# Patient Record
Sex: Female | Born: 1980 | Race: White | Hispanic: No | Marital: Single
Health system: Southern US, Community
[De-identification: ages and names within clinical notes are randomized; demographics above are authoritative.]

## PROBLEM LIST (undated history)

## (undated) DIAGNOSIS — J45909 Unspecified asthma, uncomplicated: Secondary | ICD-10-CM

## (undated) DIAGNOSIS — Z5189 Encounter for other specified aftercare: Secondary | ICD-10-CM

## (undated) DIAGNOSIS — D689 Coagulation defect, unspecified: Secondary | ICD-10-CM

## (undated) HISTORY — DX: Coagulation defect, unspecified: D68.9

## (undated) HISTORY — DX: Encounter for other specified aftercare: Z51.89

## (undated) HISTORY — PX: BRAIN SURGERY: SHX531

## (undated) HISTORY — PX: CHOLECYSTECTOMY: SHX55

## (undated) HISTORY — DX: Unspecified asthma, uncomplicated: J45.909

---

## 2003-08-31 ENCOUNTER — Other Ambulatory Visit: Payer: Self-pay

## 2004-06-30 ENCOUNTER — Ambulatory Visit: Payer: Self-pay | Admitting: Specialist

## 2004-09-27 ENCOUNTER — Emergency Department: Payer: Self-pay | Admitting: Emergency Medicine
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
## 2005-06-11 ENCOUNTER — Emergency Department: Payer: Self-pay | Admitting: Emergency Medicine

## 2005-07-02 ENCOUNTER — Emergency Department: Payer: Self-pay | Admitting: Emergency Medicine

## 2005-07-23 ENCOUNTER — Emergency Department: Payer: Self-pay | Admitting: Unknown Physician Specialty

## 2006-11-01 ENCOUNTER — Ambulatory Visit: Payer: Self-pay | Admitting: Specialist

## 2006-11-05 ENCOUNTER — Ambulatory Visit: Payer: Self-pay | Admitting: Specialist

## 2006-11-07 ENCOUNTER — Emergency Department: Payer: Self-pay | Admitting: Emergency Medicine

## 2007-07-24 ENCOUNTER — Emergency Department: Payer: Self-pay | Admitting: Emergency Medicine

## 2007-12-09 ENCOUNTER — Emergency Department: Payer: Self-pay | Admitting: Emergency Medicine

## 2007-12-12 ENCOUNTER — Emergency Department: Payer: Self-pay | Admitting: Emergency Medicine

## 2007-12-19 ENCOUNTER — Emergency Department: Payer: Self-pay | Admitting: Emergency Medicine

## 2008-02-14 ENCOUNTER — Emergency Department: Payer: Self-pay | Admitting: Emergency Medicine

## 2008-05-03 ENCOUNTER — Ambulatory Visit: Payer: Self-pay

## 2008-08-11 ENCOUNTER — Emergency Department: Payer: Self-pay | Admitting: Emergency Medicine

## 2009-04-12 ENCOUNTER — Emergency Department: Payer: Self-pay | Admitting: Emergency Medicine

## 2009-06-09 ENCOUNTER — Emergency Department: Payer: Self-pay | Admitting: Emergency Medicine

## 2009-09-28 ENCOUNTER — Emergency Department: Payer: Self-pay | Admitting: Emergency Medicine

## 2009-12-10 ENCOUNTER — Emergency Department: Payer: Self-pay | Admitting: Emergency Medicine

## 2010-05-16 ENCOUNTER — Emergency Department: Payer: Self-pay | Admitting: Internal Medicine

## 2010-07-31 ENCOUNTER — Emergency Department: Payer: Self-pay | Admitting: Emergency Medicine

## 2011-02-24 ENCOUNTER — Emergency Department: Payer: Self-pay | Admitting: Emergency Medicine

## 2011-07-28 ENCOUNTER — Emergency Department: Payer: Self-pay | Admitting: Internal Medicine

## 2011-07-28 LAB — CBC WITH DIFFERENTIAL/PLATELET
Basophil #: 0 10*3/uL (ref 0.0–0.1)
Lymphocyte #: 1.6 10*3/uL (ref 1.0–3.6)
MCH: 31.7 pg (ref 26.0–34.0)
MCHC: 33.8 g/dL (ref 32.0–36.0)
MCV: 94 fL (ref 80–100)
Monocyte #: 0.4 10*3/uL (ref 0.0–0.7)
Platelet: 231 10*3/uL (ref 150–440)
RDW: 12.7 % (ref 11.5–14.5)

## 2011-07-28 LAB — BASIC METABOLIC PANEL
Anion Gap: 9 (ref 7–16)
Calcium, Total: 9.1 mg/dL (ref 8.5–10.1)
Chloride: 105 mmol/L (ref 98–107)
EGFR (African American): >60
EGFR (Non-African Amer.): >60
Glucose: 88 mg/dL (ref 65–99)
Potassium: 4.1 mmol/L (ref 3.5–5.1)
Sodium: 140 mmol/L (ref 136–145)

## 2011-11-26 ENCOUNTER — Emergency Department: Payer: Self-pay | Admitting: Emergency Medicine

## 2011-11-26 LAB — BASIC METABOLIC PANEL
Anion Gap: 8 (ref 7–16)
Creatinine: 0.92 mg/dL (ref 0.60–1.30)
Osmolality: 279 (ref 275–301)
Potassium: 3.8 mmol/L (ref 3.5–5.1)

## 2011-11-26 LAB — CBC
MCH: 31.1 pg (ref 26.0–34.0)
MCHC: 33.2 g/dL (ref 32.0–36.0)
MCV: 94 fL (ref 80–100)
RBC: 4.28 10*6/uL (ref 3.80–5.20)
RDW: 12.7 % (ref 11.5–14.5)

## 2012-02-13 ENCOUNTER — Ambulatory Visit: Payer: Self-pay

## 2012-02-13 ENCOUNTER — Other Ambulatory Visit: Payer: Self-pay | Admitting: Occupational Medicine

## 2012-02-13 DIAGNOSIS — R52 Pain, unspecified: Secondary | ICD-10-CM

## 2012-03-24 ENCOUNTER — Emergency Department: Payer: Self-pay | Admitting: Emergency Medicine

## 2012-03-24 LAB — COMPREHENSIVE METABOLIC PANEL
Albumin: 4.1 g/dL (ref 3.4–5.0)
Anion Gap: 7 (ref 7–16)
Calcium, Total: 8.7 mg/dL (ref 8.5–10.1)
Chloride: 104 mmol/L (ref 98–107)
Co2: 29 mmol/L (ref 21–32)
EGFR (African American): >60
EGFR (Non-African Amer.): >60
Osmolality: 278 (ref 275–301)
Potassium: 3.4 mmol/L — ABNORMAL LOW (ref 3.5–5.1)
SGOT(AST): 30 U/L (ref 15–37)
Sodium: 140 mmol/L (ref 136–145)

## 2012-03-24 LAB — CBC
HCT: 37.2 % (ref 35.0–47.0)
Platelet: 220 10*3/uL (ref 150–440)
RBC: 4.05 10*6/uL (ref 3.80–5.20)
RDW: 13.1 % (ref 11.5–14.5)
WBC: 7.5 10*3/uL (ref 3.6–11.0)

## 2012-03-24 LAB — TROPONIN I: Troponin-I: <0.02 ng/mL

## 2012-09-11 ENCOUNTER — Emergency Department: Payer: Self-pay | Admitting: Emergency Medicine

## 2013-09-02 ENCOUNTER — Emergency Department: Payer: Self-pay | Admitting: Emergency Medicine

## 2013-09-02 ENCOUNTER — Ambulatory Visit: Payer: Self-pay | Admitting: Podiatry

## 2013-09-02 LAB — CBC
HCT: 37.9 % (ref 35.0–47.0)
HGB: 12.6 g/dL (ref 12.0–16.0)
MCH: 31.3 pg (ref 26.0–34.0)
MCHC: 33.2 g/dL (ref 32.0–36.0)
MCV: 94 fL (ref 80–100)
Platelet: 206 10*3/uL (ref 150–440)
RBC: 4.02 10*6/uL (ref 3.80–5.20)
RDW: 13.1 % (ref 11.5–14.5)
WBC: 10.6 10*3/uL (ref 3.6–11.0)

## 2013-09-02 LAB — COMPREHENSIVE METABOLIC PANEL
ALK PHOS: 63 U/L
Albumin: 4 g/dL (ref 3.4–5.0)
Anion Gap: 6 — ABNORMAL LOW (ref 7–16)
BUN: 14 mg/dL (ref 7–18)
Bilirubin,Total: 0.4 mg/dL (ref 0.2–1.0)
CREATININE: 0.77 mg/dL (ref 0.60–1.30)
Calcium, Total: 8.7 mg/dL (ref 8.5–10.1)
Chloride: 105 mmol/L (ref 98–107)
Co2: 26 mmol/L (ref 21–32)
EGFR (Non-African Amer.): >60
Glucose: 115 mg/dL — ABNORMAL HIGH (ref 65–99)
Osmolality: 275 (ref 275–301)
Potassium: 3.4 mmol/L — ABNORMAL LOW (ref 3.5–5.1)
SGOT(AST): 28 U/L (ref 15–37)
SGPT (ALT): 30 U/L (ref 12–78)
Sodium: 137 mmol/L (ref 136–145)
TOTAL PROTEIN: 7.7 g/dL (ref 6.4–8.2)

## 2013-09-02 LAB — CK TOTAL AND CKMB (NOT AT ARMC)
CK, TOTAL: 258 U/L — AB
CK-MB: 5 ng/mL — ABNORMAL HIGH (ref 0.5–3.6)

## 2013-09-02 LAB — TROPONIN I: Troponin-I: <0.02 ng/mL

## 2013-09-16 ENCOUNTER — Ambulatory Visit: Payer: Self-pay | Admitting: Podiatry

## 2014-03-26 ENCOUNTER — Emergency Department: Payer: Self-pay | Admitting: Emergency Medicine

## 2014-10-06 ENCOUNTER — Ambulatory Visit: Payer: Self-pay | Admitting: General Surgery

## 2014-11-03 ENCOUNTER — Inpatient Hospital Stay: Admission: RE | Admit: 2014-11-03 | Payer: Self-pay | Source: Ambulatory Visit

## 2014-11-05 ENCOUNTER — Ambulatory Visit: Admission: RE | Admit: 2014-11-05 | Payer: BLUE CROSS/BLUE SHIELD | Source: Ambulatory Visit

## 2014-11-08 ENCOUNTER — Encounter: Admission: RE | Payer: Self-pay | Source: Ambulatory Visit

## 2014-11-08 SURGERY — CORRECTION, HALLUX VALGUS
Anesthesia: Monitor Anesthesia Care | Laterality: Right

## 2014-12-16 ENCOUNTER — Encounter (HOSPITAL_COMMUNITY): Payer: Self-pay | Admitting: Emergency Medicine

## 2014-12-16 ENCOUNTER — Emergency Department (HOSPITAL_COMMUNITY): Payer: Worker's Compensation

## 2014-12-16 ENCOUNTER — Emergency Department (HOSPITAL_COMMUNITY)
Admission: EM | Admit: 2014-12-16 | Discharge: 2014-12-16 | Disposition: A | Discharge status: Home / Self Care | Payer: Worker's Compensation | Attending: Emergency Medicine | Admitting: Emergency Medicine

## 2014-12-16 DIAGNOSIS — Z87828 Personal history of other (healed) physical injury and trauma: Secondary | ICD-10-CM | POA: Diagnosis not present

## 2014-12-16 DIAGNOSIS — K029 Dental caries, unspecified: Secondary | ICD-10-CM | POA: Diagnosis not present

## 2014-12-16 DIAGNOSIS — Z720 Tobacco use: Secondary | ICD-10-CM | POA: Diagnosis not present

## 2014-12-16 DIAGNOSIS — S199XXA Unspecified injury of neck, initial encounter: Secondary | ICD-10-CM | POA: Diagnosis present

## 2014-12-16 DIAGNOSIS — Y929 Unspecified place or not applicable: Secondary | ICD-10-CM | POA: Diagnosis not present

## 2014-12-16 DIAGNOSIS — Z3202 Encounter for pregnancy test, result negative: Secondary | ICD-10-CM | POA: Insufficient documentation

## 2014-12-16 DIAGNOSIS — M62838 Other muscle spasm: Secondary | ICD-10-CM

## 2014-12-16 DIAGNOSIS — Z23 Encounter for immunization: Secondary | ICD-10-CM | POA: Diagnosis not present

## 2014-12-16 DIAGNOSIS — S0031XA Abrasion of nose, initial encounter: Secondary | ICD-10-CM | POA: Diagnosis not present

## 2014-12-16 DIAGNOSIS — W2209XA Striking against other stationary object, initial encounter: Secondary | ICD-10-CM | POA: Insufficient documentation

## 2014-12-16 DIAGNOSIS — S0033XA Contusion of nose, initial encounter: Secondary | ICD-10-CM | POA: Diagnosis not present

## 2014-12-16 DIAGNOSIS — R52 Pain, unspecified: Secondary | ICD-10-CM

## 2014-12-16 DIAGNOSIS — Y939 Activity, unspecified: Secondary | ICD-10-CM | POA: Diagnosis not present

## 2014-12-16 DIAGNOSIS — Y990 Civilian activity done for income or pay: Secondary | ICD-10-CM | POA: Insufficient documentation

## 2014-12-16 LAB — POC URINE PREG, ED: PREG TEST UR: NEGATIVE

## 2014-12-16 MED ORDER — NAPROXEN 250 MG PO TABS
500.0000 mg | ORAL_TABLET | Freq: Once | ORAL | Status: AC
  Administered 2014-12-16: 500 mg via ORAL
  Filled 2014-12-16: qty 2

## 2014-12-16 MED ORDER — TETANUS-DIPHTH-ACELL PERTUSSIS 5-2.5-18.5 LF-MCG/0.5 IM SUSP
0.5000 mL | Freq: Once | INTRAMUSCULAR | Status: AC
  Administered 2014-12-16: 0.5 mL via INTRAMUSCULAR
  Filled 2014-12-16: qty 0.5

## 2014-12-16 MED ORDER — MELOXICAM 7.5 MG PO TABS: 7.5000 mg | ORAL_TABLET | Freq: Every day | ORAL | Status: DC

## 2014-12-16 MED ORDER — METHOCARBAMOL 500 MG PO TABS: 500.0000 mg | ORAL_TABLET | Freq: Two times a day (BID) | ORAL | Status: DC

## 2014-12-16 MED ORDER — METHOCARBAMOL 500 MG PO TABS
1000.0000 mg | ORAL_TABLET | Freq: Once | ORAL | Status: AC
  Administered 2014-12-16: 1000 mg via ORAL
  Filled 2014-12-16: qty 2
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
## 2014-12-16 NOTE — ED Notes (Signed)
Pt stable, ambulatory, states understanding of discharge instructions 
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
## 2015-03-14 ENCOUNTER — Emergency Department
Admission: EM | Admit: 2015-03-14 | Discharge: 2015-03-14 | Disposition: A | Discharge status: Home / Self Care | Payer: Self-pay | Attending: Emergency Medicine | Admitting: Emergency Medicine

## 2015-03-14 ENCOUNTER — Emergency Department: Payer: Self-pay

## 2015-03-14 ENCOUNTER — Emergency Department: Payer: BLUE CROSS/BLUE SHIELD

## 2015-03-14 ENCOUNTER — Encounter: Payer: Self-pay | Admitting: Emergency Medicine

## 2015-03-14 DIAGNOSIS — Z720 Tobacco use: Secondary | ICD-10-CM | POA: Insufficient documentation

## 2015-03-14 DIAGNOSIS — R42 Dizziness and giddiness: Secondary | ICD-10-CM | POA: Insufficient documentation

## 2015-03-14 LAB — CBC WITH DIFFERENTIAL/PLATELET
Basophils Absolute: 0.1 10*3/uL (ref 0–0.1)
Basophils Relative: 1 %
Eosinophils Absolute: 0.1 10*3/uL (ref 0–0.7)
Eosinophils Relative: 1 %
HEMATOCRIT: 34.4 % — AB (ref 35.0–47.0)
Hemoglobin: 11.9 g/dL — ABNORMAL LOW (ref 12.0–16.0)
LYMPHS PCT: 26 %
Lymphs Abs: 2.4 10*3/uL (ref 1.0–3.6)
MCH: 31.7 pg (ref 26.0–34.0)
MCHC: 34.5 g/dL (ref 32.0–36.0)
MCV: 91.9 fL (ref 80.0–100.0)
MONO ABS: 0.5 10*3/uL (ref 0.2–0.9)
Monocytes Relative: 6 %
NEUTROS ABS: 6 10*3/uL (ref 1.4–6.5)
Neutrophils Relative %: 66 %
Platelets: 199 10*3/uL (ref 150–440)
RBC: 3.75 MIL/uL — ABNORMAL LOW (ref 3.80–5.20)
RDW: 12.5 % (ref 11.5–14.5)
WBC: 9 10*3/uL (ref 3.6–11.0)

## 2015-03-14 LAB — BASIC METABOLIC PANEL
Anion gap: 3 — ABNORMAL LOW (ref 5–15)
BUN: 16 mg/dL (ref 6–20)
CO2: 27 mmol/L (ref 22–32)
CREATININE: 0.65 mg/dL (ref 0.44–1.00)
Calcium: 8.7 mg/dL — ABNORMAL LOW (ref 8.9–10.3)
Chloride: 108 mmol/L (ref 101–111)
GFR calc Af Amer: >60 mL/min (ref >60)
GLUCOSE: 103 mg/dL — AB (ref 65–99)
Potassium: 3.5 mmol/L (ref 3.5–5.1)
Sodium: 138 mmol/L (ref 135–145)

## 2015-03-14 MED ORDER — MECLIZINE HCL 25 MG PO TABS
ORAL_TABLET | ORAL | Status: AC
  Filled 2015-03-14: qty 1

## 2015-03-14 MED ORDER — MECLIZINE HCL 25 MG PO TABS: 25.0000 mg | ORAL_TABLET | Freq: Three times a day (TID) | ORAL | Status: DC | PRN

## 2015-03-14 MED ORDER — MECLIZINE HCL 25 MG PO TABS
25.0000 mg | ORAL_TABLET | Freq: Once | ORAL | Status: AC
  Administered 2015-03-14: 25 mg via ORAL

## 2015-03-14 NOTE — ED Notes (Signed)
Pt up to br and still reports dizziness is the same.

## 2015-03-14 NOTE — Discharge Instructions (Signed)
Dizziness °Dizziness is a common problem. It makes you feel unsteady or lightheaded. You may feel like you are about to pass out (faint). Dizziness can lead to injury if you stumble or fall. Anyone can get dizzy, but dizziness is more common in older adults. This condition can be caused by a number of things, including: °· Medicines. °· Dehydration. °· Illness. °HOME CARE °Following these instructions may help with your condition: °Eating and Drinking °· Drink enough fluid to keep your pee (urine) clear or pale yellow. This helps to keep you from getting dehydrated. Try to drink more clear fluids, such as water. °· Do not drink alcohol. °· Limit how much caffeine you drink or eat if told by your doctor. °· Limit how much salt you drink or eat if told by your doctor. °Activity °· Avoid making quick movements. °¨ When you stand up from sitting in a chair, steady yourself until you feel okay. °¨ In the morning, first sit up on the side of the bed. When you feel okay, stand slowly while you hold onto something. Do this until you know that your balance is fine. °· Move your legs often if you need to stand in one place for a long time. Tighten and relax your muscles in your legs while you are standing. °· Do not drive or use heavy machinery if you feel dizzy. °· Avoid bending down if you feel dizzy. Place items in your home so that they are easy for you to reach without leaning over. °Lifestyle °· Do not use any tobacco products, including cigarettes, chewing tobacco, or electronic cigarettes. If you need help quitting, ask your doctor. °· Try to lower your stress level, such as with yoga or meditation. Talk with your doctor if you need help. °General Instructions °· Watch your dizziness for any changes. °· Take medicines only as told by your doctor. Talk with your doctor if you think that your dizziness is caused by a medicine that you are taking. °· Tell a friend or a family member that you are feeling dizzy. If he or  she notices any changes in your behavior, have this person call your doctor. °· Keep all follow-up visits as told by your doctor. This is important. °GET HELP IF: °· Your dizziness does not go away. °· Your dizziness or light-headedness gets worse. °· You feel sick to your stomach (nauseous). °· You have trouble hearing. °· You have new symptoms. °· You are unsteady on your feet or you feel like the room is spinning. °GET HELP RIGHT AWAY IF: °· You throw up (vomit) or have diarrhea and are unable to eat or drink anything. °· You have trouble: °¨ Talking. °¨ Walking. °¨ Swallowing. °¨ Using your arms, hands, or legs. °· You feel generally weak. °· You are not thinking clearly or you have trouble forming sentences. It may take a friend or family member to notice this. °· You have: °¨ Chest pain. °¨ Pain in your belly (abdomen). °¨ Shortness of breath. °¨ Sweating. °· Your vision changes. °· You are bleeding. °· You have a headache. °· You have neck pain or a stiff neck. °· You have a fever. °  °This information is not intended to replace advice given to you by your health care provider. Make sure you discuss any questions you have with your health care provider. °  °Document Released: 05/10/2011 Document Revised: 10/05/2014 Document Reviewed: 05/17/2014 °Elsevier Interactive Patient Education ©2016 [employer] ° °
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
## 2015-03-14 NOTE — ED Notes (Signed)
Report given to Nellie, RN.  
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
## 2015-05-05 ENCOUNTER — Encounter: Payer: Self-pay | Admitting: Emergency Medicine

## 2015-05-05 ENCOUNTER — Emergency Department
Admission: EM | Admit: 2015-05-05 | Discharge: 2015-05-05 | Disposition: A | Discharge status: Home / Self Care | Payer: Self-pay | Attending: Emergency Medicine | Admitting: Emergency Medicine

## 2015-05-05 DIAGNOSIS — Y990 Civilian activity done for income or pay: Secondary | ICD-10-CM | POA: Insufficient documentation

## 2015-05-05 DIAGNOSIS — S0182XA Laceration with foreign body of other part of head, initial encounter: Secondary | ICD-10-CM | POA: Insufficient documentation

## 2015-05-05 DIAGNOSIS — Y9389 Activity, other specified: Secondary | ICD-10-CM | POA: Insufficient documentation

## 2015-05-05 DIAGNOSIS — S0085XA Superficial foreign body of other part of head, initial encounter: Secondary | ICD-10-CM

## 2015-05-05 DIAGNOSIS — Y9289 Other specified places as the place of occurrence of the external cause: Secondary | ICD-10-CM | POA: Insufficient documentation

## 2015-05-05 DIAGNOSIS — Y288XXA Contact with other sharp object, undetermined intent, initial encounter: Secondary | ICD-10-CM | POA: Insufficient documentation

## 2015-05-05 DIAGNOSIS — F17200 Nicotine dependence, unspecified, uncomplicated: Secondary | ICD-10-CM | POA: Insufficient documentation

## 2015-05-05 MED ORDER — LIDOCAINE-EPINEPHRINE (PF) 1 %-1:200000 IJ SOLN
INTRAMUSCULAR | Status: AC
  Filled 2015-05-05: qty 30

## 2015-05-05 NOTE — ED Notes (Signed)
States she thinks she may have some shaving from a machine in face.   This happened about 3-4 weeks ago   And tried to get it out herself  Now having increased pain
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
## 2015-05-05 NOTE — Discharge Instructions (Signed)
Sliver Removal, Care After A sliver--also called a splinter--is a small and thin broken piece of an object that gets stuck (embedded) under the skin. A sliver can create a deep wound that can easily become infected. It is important to care for the wound after a sliver is removed to help prevent infection and other problems from developing. WHAT TO EXPECT AFTER THE PROCEDURE Slivers often break into smaller pieces when they are removed. If pieces of your sliver broke off and stayed in your skin, you will eventually see them working themselves out and you may feel some pain at the wound site. This is normal. HOME CARE INSTRUCTIONS  Keep all follow-up visits as directed by your health care provider. This is important.  There are many different ways to close and cover a wound, including stitches (sutures) and adhesive strips. Follow your health care provider's instructions about:  Wound care.  Bandage (dressing) changes and removal.  Wound closure removal.  Check the wound site every day for signs of infection. Watch for:  Red streaks coming from the wound.  Fever.  Redness or tenderness around the wound.  Fluid, blood, or pus coming from the wound.  A bad smell coming from the wound. SEEK MEDICAL CARE IF:  You think that a piece of the sliver is still in your skin.  Your wound was closed, as with sutures, and the edges of the wound break open.  You have signs of infection, including:  New or worsening redness around the wound.  New or worsening tenderness around the wound.  Fluid, blood, or pus coming from the wound.  A bad smell coming from the wound or dressing. SEEK IMMEDIATE MEDICAL CARE IF: You have any of the following signs of infection:  Red streaks coming from the wound.  An unexplained fever.   This information is not intended to replace advice given to you by your health care provider. Make sure you discuss any questions you have with your health care  provider.   Document Released: 05/18/2000 Document Revised: 06/11/2014 Document Reviewed: 01/21/2014 Elsevier Interactive Patient Education 2016 [employer]  

## 2016-05-23 ENCOUNTER — Emergency Department: Payer: Self-pay

## 2016-05-23 ENCOUNTER — Encounter: Payer: Self-pay | Admitting: Emergency Medicine

## 2016-05-23 ENCOUNTER — Emergency Department
Admission: EM | Admit: 2016-05-23 | Discharge: 2016-05-23 | Disposition: A | Discharge status: Home / Self Care | Payer: Self-pay | Attending: Emergency Medicine | Admitting: Emergency Medicine

## 2016-05-23 DIAGNOSIS — Z791 Long term (current) use of non-steroidal anti-inflammatories (NSAID): Secondary | ICD-10-CM | POA: Insufficient documentation

## 2016-05-23 DIAGNOSIS — F17200 Nicotine dependence, unspecified, uncomplicated: Secondary | ICD-10-CM | POA: Insufficient documentation

## 2016-05-23 DIAGNOSIS — N939 Abnormal uterine and vaginal bleeding, unspecified: Secondary | ICD-10-CM | POA: Insufficient documentation

## 2016-05-23 DIAGNOSIS — R1031 Right lower quadrant pain: Secondary | ICD-10-CM

## 2016-05-23 LAB — URINALYSIS, COMPLETE (UACMP) WITH MICROSCOPIC
Bilirubin Urine: NEGATIVE
Glucose, UA: NEGATIVE mg/dL
Hgb urine dipstick: NEGATIVE
Ketones, ur: NEGATIVE mg/dL
LEUKOCYTES UA: NEGATIVE
Nitrite: NEGATIVE
PH: 5 (ref 5.0–8.0)
Protein, ur: 30 mg/dL — AB
Specific Gravity, Urine: 1.025 (ref 1.005–1.030)

## 2016-05-23 LAB — CHLAMYDIA/NGC RT PCR (ARMC ONLY)
CHLAMYDIA TR: NOT DETECTED
N GONORRHOEAE: NOT DETECTED

## 2016-05-23 LAB — CBC WITH DIFFERENTIAL/PLATELET
BASOS ABS: 0.1 10*3/uL (ref 0–0.1)
Basophils Relative: 1 %
Eosinophils Absolute: 0.1 10*3/uL (ref 0–0.7)
Eosinophils Relative: 1 %
HEMATOCRIT: 38.1 % (ref 35.0–47.0)
Hemoglobin: 13.2 g/dL (ref 12.0–16.0)
LYMPHS ABS: 2 10*3/uL (ref 1.0–3.6)
Lymphocytes Relative: 20 %
MCH: 31.7 pg (ref 26.0–34.0)
MCHC: 34.6 g/dL (ref 32.0–36.0)
MCV: 91.8 fL (ref 80.0–100.0)
Monocytes Absolute: 0.6 10*3/uL (ref 0.2–0.9)
Monocytes Relative: 6 %
NEUTROS ABS: 7 10*3/uL — AB (ref 1.4–6.5)
Neutrophils Relative %: 72 %
Platelets: 249 10*3/uL (ref 150–440)
RBC: 4.15 MIL/uL (ref 3.80–5.20)
RDW: 13.2 % (ref 11.5–14.5)
WBC: 9.6 10*3/uL (ref 3.6–11.0)

## 2016-05-23 LAB — COMPREHENSIVE METABOLIC PANEL
ALT: 36 U/L (ref 14–54)
AST: 34 U/L (ref 15–41)
Albumin: 4.4 g/dL (ref 3.5–5.0)
Alkaline Phosphatase: 57 U/L (ref 38–126)
Anion gap: 7 (ref 5–15)
BUN: 10 mg/dL (ref 6–20)
CO2: 27 mmol/L (ref 22–32)
CREATININE: 0.72 mg/dL (ref 0.44–1.00)
Calcium: 9.3 mg/dL (ref 8.9–10.3)
Chloride: 103 mmol/L (ref 101–111)
Glucose, Bld: 104 mg/dL — ABNORMAL HIGH (ref 65–99)
Potassium: 3.3 mmol/L — ABNORMAL LOW (ref 3.5–5.1)
Sodium: 137 mmol/L (ref 135–145)
TOTAL PROTEIN: 7.7 g/dL (ref 6.5–8.1)
Total Bilirubin: 1 mg/dL (ref 0.3–1.2)

## 2016-05-23 LAB — WET PREP, GENITAL
SPERM: NONE SEEN
Trich, Wet Prep: NONE SEEN
WBC, Wet Prep HPF POC: NONE SEEN
Yeast Wet Prep HPF POC: NONE SEEN

## 2016-05-23 LAB — PREGNANCY, URINE: PREG TEST UR: NEGATIVE

## 2016-05-23 MED ORDER — KETOROLAC TROMETHAMINE 30 MG/ML IJ SOLN
30.0000 mg | Freq: Once | INTRAMUSCULAR | Status: AC
  Administered 2016-05-23: 30 mg via INTRAVENOUS
  Filled 2016-05-23: qty 1
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
## 2016-05-23 NOTE — ED Triage Notes (Signed)
Pt in with co irregular periods this month and today started having mid abd pain.

## 2016-05-23 NOTE — Discharge Instructions (Signed)
You have been seen in the Emergency Department (ED) for abdominal pain.  Your evaluation did not identify a clear cause of your symptoms but was generally reassuring.  Abdominal pain has many possible causes. Some aren't serious and get better on their own in a few days. Others need more testing and treatment. If your pain continues or gets worse, you need to be rechecked and may need more tests to find out what is wrong. You may need surgery to correct the problem.   [facility] 12-24 hours if you are still having abdominal pain. Otherwise follow up in 1-3 days for a re-check  Don't ignore new symptoms, such as fever, nausea and vomiting, new or worsening abdominal pain, urination problems, bloody diarrhea or bloody stools, black tarry stools, uncontrollable nausea and vomiting, and dizziness. These may be signs of a more serious problem. If you develop any of these you should be seen by your doctor immediately or return to the ED.   How can you care for yourself at home?  Rest until you feel better.  To prevent dehydration, drink plenty of fluids, enough so that your urine is light yellow or clear like water. Choose water and other caffeine-free clear liquids until you feel better. If you have kidney, heart, or liver disease and have to limit fluids, talk with your doctor before you increase the amount of fluids you drink.  If your stomach is upset, eat mild foods, such as rice, dry toast or crackers, bananas, and applesauce. Try eating several small meals instead of two or three large ones.  Wait until 48 hours after all symptoms have gone away before you have spicy foods, alcohol, and drinks that contain caffeine.  Do not eat foods that are high in fat.  Avoid anti-inflammatory medicines such as aspirin, ibuprofen (Advil, Motrin), and naproxen (Aleve). These can cause stomach upset. Talk to your doctor if you take daily aspirin for another health problem.  When should you  call for help?  Call 911 anytime you think you may need emergency care. For example, call if:  You passed out (lost consciousness).  You pass maroon or very bloody stools.  You vomit blood or what looks like coffee grounds.  You have new, severe belly pain.  Call your doctor now or seek immediate medical care if:  Your pain gets worse, especially if it becomes focused in one area of your belly.  You have a new or higher fever.  Your stools are black and look like tar, or they have streaks of blood.  You have unexpected vaginal bleeding.  You have symptoms of a urinary tract infection. These may include:  Pain when you urinate.  Urinating more often than usual.  Blood in your urine. You are dizzy or lightheaded, or you feel like you may faint. Watch closely for changes in your health, and be sure to contact your doctor if:  You are not getting better after 1 day (24 hours).

## 2016-05-23 NOTE — ED Notes (Signed)
Pt currently in [place].

## 2016-07-13 ENCOUNTER — Encounter: Payer: Self-pay | Admitting: Emergency Medicine

## 2016-07-13 ENCOUNTER — Emergency Department
Admission: EM | Admit: 2016-07-13 | Discharge: 2016-07-13 | Disposition: A | Discharge status: Home / Self Care | Payer: Self-pay | Attending: Emergency Medicine | Admitting: Emergency Medicine

## 2016-07-13 ENCOUNTER — Emergency Department: Payer: Self-pay

## 2016-07-13 DIAGNOSIS — F17200 Nicotine dependence, unspecified, uncomplicated: Secondary | ICD-10-CM | POA: Insufficient documentation

## 2016-07-13 DIAGNOSIS — G44209 Tension-type headache, unspecified, not intractable: Secondary | ICD-10-CM | POA: Insufficient documentation

## 2016-07-13 LAB — POCT PREGNANCY, URINE: PREG TEST UR: NEGATIVE

## 2016-07-13 MED ORDER — METOCLOPRAMIDE HCL 10 MG PO TABS: 10.0000 mg | ORAL_TABLET | Freq: Three times a day (TID) | ORAL | 0 refills | Status: DC | PRN

## 2016-07-13 MED ORDER — METOCLOPRAMIDE HCL 5 MG/ML IJ SOLN: 10.0000 mg | Freq: Once | INTRAMUSCULAR | Status: DC

## 2016-07-13 MED ORDER — IBUPROFEN 800 MG PO TABS: 800.0000 mg | ORAL_TABLET | Freq: Three times a day (TID) | ORAL | 0 refills | Status: AC | PRN

## 2016-07-13 MED ORDER — SODIUM CHLORIDE 0.9 % IV BOLUS (SEPSIS)
500.0000 mL | Freq: Once | INTRAVENOUS | Status: AC
  Administered 2016-07-13: 500 mL via INTRAVENOUS

## 2016-07-13 MED ORDER — KETOROLAC TROMETHAMINE 30 MG/ML IJ SOLN
15.0000 mg | Freq: Once | INTRAMUSCULAR | Status: AC
  Administered 2016-07-13: 15 mg via INTRAVENOUS
  Filled 2016-07-13: qty 1
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
## 2016-07-13 NOTE — Discharge Instructions (Signed)
Please drink plenty of fluids and get some bed rest and he can also take Tylenol over-the-counter for the headache. Please keep track of your blood pressures by writing them down to see if the blood pressure improved with resolution of your headache. Try to decrease the salt in your diet.

## 2017-06-05 ENCOUNTER — Encounter: Payer: Self-pay | Admitting: Emergency Medicine

## 2017-06-05 ENCOUNTER — Emergency Department
Admission: EM | Admit: 2017-06-05 | Discharge: 2017-06-05 | Disposition: A | Discharge status: Home / Self Care | Payer: Commercial Managed Care - PPO | Attending: Emergency Medicine | Admitting: Emergency Medicine

## 2017-06-05 ENCOUNTER — Other Ambulatory Visit: Payer: Self-pay

## 2017-06-05 DIAGNOSIS — F17210 Nicotine dependence, cigarettes, uncomplicated: Secondary | ICD-10-CM | POA: Insufficient documentation

## 2017-06-05 DIAGNOSIS — M7918 Myalgia, other site: Secondary | ICD-10-CM | POA: Insufficient documentation

## 2017-06-05 DIAGNOSIS — M25512 Pain in left shoulder: Secondary | ICD-10-CM | POA: Diagnosis present

## 2017-06-05 DIAGNOSIS — R05 Cough: Secondary | ICD-10-CM | POA: Diagnosis not present

## 2017-06-05 DIAGNOSIS — Z79899 Other long term (current) drug therapy: Secondary | ICD-10-CM | POA: Insufficient documentation

## 2017-06-05 MED ORDER — MELOXICAM 15 MG PO TABS: 15.0000 mg | ORAL_TABLET | Freq: Every day | ORAL | 2 refills | Status: AC

## 2017-06-05 MED ORDER — BACLOFEN 10 MG PO TABS: 10.0000 mg | ORAL_TABLET | Freq: Every day | ORAL | 1 refills | Status: AC

## 2017-06-05 NOTE — Discharge Instructions (Signed)
Follow-up with your regular doctor or Dr. Hyacinth MeekerMiller for evaluation of your musculoskeletal pain, take meloxicam 15 mg/day, also take the baclofen as needed for muscle type pain, or to not operate heavy machinery while taking this medication, if you are working just take it at night for sleep, apply wet heat followed by ice to any areas that hurt, if you are having difficulty sleeping take over-the-counter melatonin

## 2017-06-05 NOTE — ED Notes (Signed)
Pt verbalized understanding of discharge instructions. NAD at this time. 
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
## 2017-06-05 NOTE — ED Triage Notes (Signed)
Pt with left arm pain for 7-8 mths and cough at times. Pt states she has to sleep with her left arm over her head.

## 2017-09-13 ENCOUNTER — Encounter: Payer: Self-pay | Admitting: General Surgery

## 2018-04-01 ENCOUNTER — Emergency Department
Admission: EM | Admit: 2018-04-01 | Discharge: 2018-04-01 | Discharge status: Against Medical Advice | Payer: Commercial Managed Care - PPO

## 2018-10-16 ENCOUNTER — Emergency Department
Admission: EM | Admit: 2018-10-16 | Discharge: 2018-10-16 | Disposition: A | Discharge status: Home / Self Care | Payer: Commercial Managed Care - PPO | Attending: Emergency Medicine | Admitting: Emergency Medicine

## 2018-10-16 ENCOUNTER — Emergency Department: Payer: Commercial Managed Care - PPO

## 2018-10-16 ENCOUNTER — Encounter: Payer: Self-pay | Admitting: Emergency Medicine

## 2018-10-16 ENCOUNTER — Other Ambulatory Visit: Payer: Self-pay

## 2018-10-16 DIAGNOSIS — F17200 Nicotine dependence, unspecified, uncomplicated: Secondary | ICD-10-CM | POA: Insufficient documentation

## 2018-10-16 DIAGNOSIS — R05 Cough: Secondary | ICD-10-CM | POA: Insufficient documentation

## 2018-10-16 DIAGNOSIS — R0602 Shortness of breath: Secondary | ICD-10-CM | POA: Insufficient documentation

## 2018-10-16 LAB — BASIC METABOLIC PANEL
Anion gap: 7 (ref 5–15)
BUN: 10 mg/dL (ref 6–20)
CO2: 25 mmol/L (ref 22–32)
Calcium: 9.2 mg/dL (ref 8.9–10.3)
Chloride: 107 mmol/L (ref 98–111)
Creatinine, Ser: 0.7 mg/dL (ref 0.44–1.00)
GFR calc Af Amer: >60 mL/min (ref >60)
GFR calc non Af Amer: >60 mL/min (ref >60)
Glucose, Bld: 104 mg/dL — ABNORMAL HIGH (ref 70–99)
Potassium: 3.4 mmol/L — ABNORMAL LOW (ref 3.5–5.1)
Sodium: 139 mmol/L (ref 135–145)

## 2018-10-16 LAB — CBC WITH DIFFERENTIAL/PLATELET
Abs Immature Granulocytes: 0.02 10*3/uL (ref 0.00–0.07)
Basophils Absolute: 0.1 10*3/uL (ref 0.0–0.1)
Basophils Relative: 1 %
Eosinophils Absolute: 0.1 10*3/uL (ref 0.0–0.5)
Eosinophils Relative: 1 %
HCT: 37.1 % (ref 36.0–46.0)
Hemoglobin: 12.2 g/dL (ref 12.0–15.0)
Immature Granulocytes: 0 %
Lymphocytes Relative: 22 %
Lymphs Abs: 2 10*3/uL (ref 0.7–4.0)
MCH: 30.8 pg (ref 26.0–34.0)
MCHC: 32.9 g/dL (ref 30.0–36.0)
MCV: 93.7 fL (ref 80.0–100.0)
Monocytes Absolute: 0.6 10*3/uL (ref 0.1–1.0)
Monocytes Relative: 6 %
Neutro Abs: 6.2 10*3/uL (ref 1.7–7.7)
Neutrophils Relative %: 70 %
Platelets: 223 10*3/uL (ref 150–400)
RBC: 3.96 MIL/uL (ref 3.87–5.11)
RDW: 12.4 % (ref 11.5–15.5)
WBC: 8.9 10*3/uL (ref 4.0–10.5)
nRBC: 0 % (ref 0.0–0.2)

## 2018-10-16 LAB — POCT PREGNANCY, URINE: Preg Test, Ur: NEGATIVE

## 2018-10-16 MED ORDER — ALBUTEROL SULFATE HFA 108 (90 BASE) MCG/ACT IN AERS: 2.0000 | INHALATION_SPRAY | Freq: Four times a day (QID) | RESPIRATORY_TRACT | 0 refills | Status: DC | PRN

## 2018-10-16 NOTE — ED Notes (Signed)
Pt resting in bed, states her breathing "feels labored," however appears to be without breathing difficulty. Dry cough noted with inspiration, talking in full sentences without struggle. Blood drawn and taken to the lab. Pt denies need for anything at this time. Call light within reach.

## 2018-10-16 NOTE — Discharge Instructions (Addendum)
Please seek medical attention for any high fevers, chest pain, shortness of breath, change in behavior, persistent vomiting, bloody stool or any other new or concerning symptoms.  

## 2018-10-16 NOTE — ED Triage Notes (Addendum)
Patient ambulatory to triage with steady gait, without difficulty or distress noted, eating food; pt reports SHOB x 1-71mos, nonprod cough
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
## 2018-10-31 ENCOUNTER — Ambulatory Visit: Payer: Self-pay | Admitting: Family Medicine

## 2018-11-08 ENCOUNTER — Emergency Department
Admission: EM | Admit: 2018-11-08 | Discharge: 2018-11-08 | Disposition: A | Discharge status: Home / Self Care | Payer: Commercial Managed Care - PPO | Attending: Emergency Medicine | Admitting: Emergency Medicine

## 2018-11-08 ENCOUNTER — Other Ambulatory Visit: Payer: Self-pay

## 2018-11-08 ENCOUNTER — Encounter: Payer: Self-pay | Admitting: Emergency Medicine

## 2018-11-08 DIAGNOSIS — W57XXXA Bitten or stung by nonvenomous insect and other nonvenomous arthropods, initial encounter: Secondary | ICD-10-CM

## 2018-11-08 DIAGNOSIS — Z79899 Other long term (current) drug therapy: Secondary | ICD-10-CM | POA: Insufficient documentation

## 2018-11-08 DIAGNOSIS — Y9389 Activity, other specified: Secondary | ICD-10-CM | POA: Diagnosis not present

## 2018-11-08 DIAGNOSIS — S80861A Insect bite (nonvenomous), right lower leg, initial encounter: Secondary | ICD-10-CM | POA: Insufficient documentation

## 2018-11-08 DIAGNOSIS — Y929 Unspecified place or not applicable: Secondary | ICD-10-CM | POA: Diagnosis not present

## 2018-11-08 DIAGNOSIS — F17210 Nicotine dependence, cigarettes, uncomplicated: Secondary | ICD-10-CM | POA: Diagnosis not present

## 2018-11-08 DIAGNOSIS — Y999 Unspecified external cause status: Secondary | ICD-10-CM | POA: Diagnosis not present

## 2018-11-08 DIAGNOSIS — A6920 Lyme disease, unspecified: Secondary | ICD-10-CM

## 2018-11-08 DIAGNOSIS — R21 Rash and other nonspecific skin eruption: Secondary | ICD-10-CM | POA: Diagnosis present

## 2018-11-08 MED ORDER — DOXYCYCLINE HYCLATE 100 MG PO TABS: 100.0000 mg | ORAL_TABLET | Freq: Two times a day (BID) | ORAL | 0 refills | Status: AC
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
## 2018-11-08 NOTE — ED Triage Notes (Signed)
States pulled a tick off of right lower leg on Tuesday.  Presents today with red area around tick bite.

## 2018-11-08 NOTE — ED Notes (Signed)
Patient reports no pain at the site of the rash, however she does report feeling slightly "lightheaded" and that "the area feels warm to the touch"

## 2019-03-02 ENCOUNTER — Emergency Department: Payer: Commercial Managed Care - PPO

## 2019-03-02 ENCOUNTER — Encounter: Payer: Self-pay | Admitting: Emergency Medicine

## 2019-03-02 ENCOUNTER — Other Ambulatory Visit: Payer: Self-pay

## 2019-03-02 DIAGNOSIS — R0602 Shortness of breath: Secondary | ICD-10-CM | POA: Insufficient documentation

## 2019-03-02 DIAGNOSIS — R921 Mammographic calcification found on diagnostic imaging of breast: Secondary | ICD-10-CM | POA: Diagnosis not present

## 2019-03-02 DIAGNOSIS — Z7722 Contact with and (suspected) exposure to environmental tobacco smoke (acute) (chronic): Secondary | ICD-10-CM | POA: Diagnosis not present

## 2019-03-02 LAB — CBC WITH DIFFERENTIAL/PLATELET
Abs Immature Granulocytes: 0.02 10*3/uL (ref 0.00–0.07)
Basophils Absolute: 0.1 10*3/uL (ref 0.0–0.1)
Basophils Relative: 1 %
Eosinophils Absolute: 0.1 10*3/uL (ref 0.0–0.5)
Eosinophils Relative: 1 %
HCT: 38 % (ref 36.0–46.0)
Hemoglobin: 12.9 g/dL (ref 12.0–15.0)
Immature Granulocytes: 0 %
Lymphocytes Relative: 26 %
Lymphs Abs: 2.1 10*3/uL (ref 0.7–4.0)
MCH: 30.9 pg (ref 26.0–34.0)
MCHC: 33.9 g/dL (ref 30.0–36.0)
MCV: 90.9 fL (ref 80.0–100.0)
Monocytes Absolute: 0.6 10*3/uL (ref 0.1–1.0)
Monocytes Relative: 7 %
Neutro Abs: 5.4 10*3/uL (ref 1.7–7.7)
Neutrophils Relative %: 65 %
Platelets: 243 10*3/uL (ref 150–400)
RBC: 4.18 MIL/uL (ref 3.87–5.11)
RDW: 12.3 % (ref 11.5–15.5)
WBC: 8.3 10*3/uL (ref 4.0–10.5)
nRBC: 0 % (ref 0.0–0.2)

## 2019-03-02 LAB — POCT PREGNANCY, URINE: Preg Test, Ur: NEGATIVE

## 2019-03-02 NOTE — ED Triage Notes (Signed)
Pt arrived via [facility] from work with sudden onset of sob. Pt states this has been going on since last August and she states she is sick of it. Pt states she was seen by a pulmonologist on Friday and was told to follow up mid October. Pt states no xray was performed or labs were drawn. Pt talkative and does not appear to be increased work of breathing noted. symptoms have resolved at this time.

## 2019-03-03 ENCOUNTER — Emergency Department
Admission: EM | Admit: 2019-03-03 | Discharge: 2019-03-03 | Disposition: A | Discharge status: Home / Self Care | Payer: Commercial Managed Care - PPO | Attending: Emergency Medicine | Admitting: Emergency Medicine

## 2019-03-03 ENCOUNTER — Emergency Department: Payer: Commercial Managed Care - PPO

## 2019-03-03 ENCOUNTER — Other Ambulatory Visit: Payer: Self-pay

## 2019-03-03 ENCOUNTER — Encounter: Payer: Self-pay | Admitting: Radiology

## 2019-03-03 DIAGNOSIS — R921 Mammographic calcification found on diagnostic imaging of breast: Secondary | ICD-10-CM

## 2019-03-03 DIAGNOSIS — R0602 Shortness of breath: Secondary | ICD-10-CM

## 2019-03-03 LAB — COMPREHENSIVE METABOLIC PANEL
ALT: 33 U/L (ref 0–44)
AST: 33 U/L (ref 15–41)
Albumin: 4.6 g/dL (ref 3.5–5.0)
Alkaline Phosphatase: 56 U/L (ref 38–126)
Anion gap: 9 (ref 5–15)
BUN: 10 mg/dL (ref 6–20)
CO2: 23 mmol/L (ref 22–32)
Calcium: 9.7 mg/dL (ref 8.9–10.3)
Chloride: 104 mmol/L (ref 98–111)
Creatinine, Ser: 0.61 mg/dL (ref 0.44–1.00)
GFR calc Af Amer: >60 mL/min (ref >60)
GFR calc non Af Amer: >60 mL/min (ref >60)
Glucose, Bld: 113 mg/dL — ABNORMAL HIGH (ref 70–99)
Potassium: 3.6 mmol/L (ref 3.5–5.1)
Sodium: 136 mmol/L (ref 135–145)
Total Bilirubin: 0.5 mg/dL (ref 0.3–1.2)
Total Protein: 8.1 g/dL (ref 6.5–8.1)

## 2019-03-03 LAB — TROPONIN I (HIGH SENSITIVITY): Troponin I (High Sensitivity): <2 ng/L (ref <18)

## 2019-03-03 MED ORDER — SODIUM CHLORIDE 0.9 % IV BOLUS
1000.0000 mL | Freq: Once | INTRAVENOUS | Status: AC
  Administered 2019-03-03: 1000 mL via INTRAVENOUS

## 2019-03-03 MED ORDER — IOHEXOL 350 MG/ML SOLN
75.0000 mL | Freq: Once | INTRAVENOUS | Status: AC | PRN
  Administered 2019-03-03: 03:00:00 75 mL via INTRAVENOUS

## 2019-03-03 MED ORDER — IPRATROPIUM-ALBUTEROL 0.5-2.5 (3) MG/3ML IN SOLN
3.0000 mL | Freq: Once | RESPIRATORY_TRACT | Status: AC
Start: 2019-03-03 — End: 2019-03-03
  Administered 2019-03-03: 3 mL via RESPIRATORY_TRACT
  Filled 2019-03-03: qty 3

## 2019-03-03 MED ORDER — PREDNISONE 20 MG PO TABS: ORAL_TABLET | ORAL | 0 refills | Status: DC

## 2019-03-03 MED ORDER — ALBUTEROL SULFATE HFA 108 (90 BASE) MCG/ACT IN AERS: 2.0000 | INHALATION_SPRAY | RESPIRATORY_TRACT | 0 refills | Status: DC | PRN

## 2019-03-03 NOTE — Discharge Instructions (Addendum)
1.  Take steroid as prescribed (prednisone 60 mg daily x5 days). 2.  You may also take Albuterol inhaler 2 puffs every 4 hours as needed for difficulty breathing. 3.  Return to the ER for worsening symptoms, persistent vomiting, difficulty breathing or other concerns.

## 2019-03-03 NOTE — ED Notes (Signed)
Patient states had episode of SOB. Per patient this issue has been going on since 01/2018.  Patient states having SOB now. Patient breathing normal and carrying conversation.

## 2019-03-03 NOTE — ED Notes (Signed)
Called lab for the add on troponin.
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
## 2019-03-03 NOTE — ED Notes (Signed)
ED Provider at bedside. 

## 2019-03-18 ENCOUNTER — Other Ambulatory Visit: Payer: Self-pay | Admitting: Obstetrics and Gynecology

## 2019-03-18 DIAGNOSIS — N6310 Unspecified lump in the right breast, unspecified quadrant: Secondary | ICD-10-CM

## 2019-03-23 ENCOUNTER — Other Ambulatory Visit: Payer: Commercial Managed Care - PPO

## 2019-04-15 ENCOUNTER — Other Ambulatory Visit: Payer: Commercial Managed Care - PPO

## 2019-04-23 ENCOUNTER — Ambulatory Visit
Admission: RE | Admit: 2019-04-23 | Discharge: 2019-04-23 | Disposition: A | Discharge status: Home / Self Care | Payer: Commercial Managed Care - PPO | Source: Ambulatory Visit | Attending: Obstetrics and Gynecology | Admitting: Obstetrics and Gynecology

## 2019-04-23 DIAGNOSIS — N6310 Unspecified lump in the right breast, unspecified quadrant: Secondary | ICD-10-CM | POA: Diagnosis not present

## 2019-04-24 ENCOUNTER — Encounter: Payer: Self-pay | Admitting: *Deleted

## 2019-12-01 ENCOUNTER — Other Ambulatory Visit
Admission: RE | Admit: 2019-12-01 | Discharge: 2019-12-01 | Disposition: A | Discharge status: Home / Self Care | Payer: Commercial Managed Care - PPO | Source: Ambulatory Visit | Attending: Specialist | Admitting: Specialist

## 2019-12-01 DIAGNOSIS — R0781 Pleurodynia: Secondary | ICD-10-CM | POA: Insufficient documentation

## 2019-12-01 DIAGNOSIS — R0602 Shortness of breath: Secondary | ICD-10-CM | POA: Insufficient documentation

## 2019-12-01 LAB — FIBRIN DERIVATIVES D-DIMER (ARMC ONLY): Fibrin derivatives D-dimer (ARMC): 289.44 ng/mL (FEU) (ref 0.00–499.00)

## 2021-10-16 ENCOUNTER — Other Ambulatory Visit: Payer: Self-pay

## 2021-10-16 ENCOUNTER — Encounter: Payer: Self-pay | Admitting: Emergency Medicine

## 2021-10-16 ENCOUNTER — Emergency Department: Payer: Commercial Managed Care - PPO

## 2021-10-16 ENCOUNTER — Emergency Department
Admission: EM | Admit: 2021-10-16 | Discharge: 2021-10-16 | Disposition: A | Discharge status: Home / Self Care | Payer: Commercial Managed Care - PPO | Attending: Emergency Medicine | Admitting: Emergency Medicine

## 2021-10-16 DIAGNOSIS — R5383 Other fatigue: Secondary | ICD-10-CM | POA: Diagnosis not present

## 2021-10-16 DIAGNOSIS — R519 Headache, unspecified: Secondary | ICD-10-CM | POA: Diagnosis not present

## 2021-10-16 DIAGNOSIS — R55 Syncope and collapse: Secondary | ICD-10-CM | POA: Insufficient documentation

## 2021-10-16 DIAGNOSIS — Z20822 Contact with and (suspected) exposure to covid-19: Secondary | ICD-10-CM | POA: Insufficient documentation

## 2021-10-16 DIAGNOSIS — R439 Unspecified disturbances of smell and taste: Secondary | ICD-10-CM | POA: Diagnosis not present

## 2021-10-16 DIAGNOSIS — M7918 Myalgia, other site: Secondary | ICD-10-CM | POA: Diagnosis not present

## 2021-10-16 LAB — BASIC METABOLIC PANEL
Anion gap: 9 (ref 5–15)
BUN: 12 mg/dL (ref 6–20)
CO2: 24 mmol/L (ref 22–32)
Calcium: 9.1 mg/dL (ref 8.9–10.3)
Chloride: 102 mmol/L (ref 98–111)
Creatinine, Ser: 0.61 mg/dL (ref 0.44–1.00)
GFR, Estimated: >60 mL/min (ref >60)
Glucose, Bld: 91 mg/dL (ref 70–99)
Potassium: 3.6 mmol/L (ref 3.5–5.1)
Sodium: 135 mmol/L (ref 135–145)

## 2021-10-16 LAB — CBC WITH DIFFERENTIAL/PLATELET
Abs Immature Granulocytes: 0.02 10*3/uL (ref 0.00–0.07)
Basophils Absolute: 0 10*3/uL (ref 0.0–0.1)
Basophils Relative: 0 %
Eosinophils Absolute: 0 10*3/uL (ref 0.0–0.5)
Eosinophils Relative: 0 %
HCT: 39.8 % (ref 36.0–46.0)
Hemoglobin: 13.3 g/dL (ref 12.0–15.0)
Immature Granulocytes: 0 %
Lymphocytes Relative: 6 %
Lymphs Abs: 0.6 10*3/uL — ABNORMAL LOW (ref 0.7–4.0)
MCH: 30.3 pg (ref 26.0–34.0)
MCHC: 33.4 g/dL (ref 30.0–36.0)
MCV: 90.7 fL (ref 80.0–100.0)
Monocytes Absolute: 0.8 10*3/uL (ref 0.1–1.0)
Monocytes Relative: 8 %
Neutro Abs: 8 10*3/uL — ABNORMAL HIGH (ref 1.7–7.7)
Neutrophils Relative %: 86 %
Platelets: 209 10*3/uL (ref 150–400)
RBC: 4.39 MIL/uL (ref 3.87–5.11)
RDW: 12.4 % (ref 11.5–15.5)
WBC: 9.4 10*3/uL (ref 4.0–10.5)
nRBC: 0 % (ref 0.0–0.2)

## 2021-10-16 LAB — RESP PANEL BY RT-PCR (FLU A&B, COVID) ARPGX2
Influenza A by PCR: NEGATIVE
Influenza B by PCR: NEGATIVE
SARS Coronavirus 2 by RT PCR: POSITIVE — AB

## 2021-10-16 MED ORDER — ONDANSETRON 4 MG PO TBDP: 4.0000 mg | ORAL_TABLET | Freq: Three times a day (TID) | ORAL | 0 refills | Status: DC | PRN

## 2021-10-16 MED ORDER — NAPROXEN 500 MG PO TABS
500.0000 mg | ORAL_TABLET | Freq: Once | ORAL | Status: AC
  Administered 2021-10-16: 500 mg via ORAL
  Filled 2021-10-16: qty 1

## 2021-10-16 MED ORDER — ONDANSETRON 8 MG PO TBDP
8.0000 mg | ORAL_TABLET | Freq: Once | ORAL | Status: AC
  Administered 2021-10-16: 8 mg via ORAL
  Filled 2021-10-16: qty 1

## 2021-10-16 MED ORDER — PAXLOVID (300/100) 20 X 150 MG & 10 X 100MG PO TBPK: 3.0000 | ORAL_TABLET | Freq: Two times a day (BID) | ORAL | 0 refills | Status: AC

## 2021-10-16 NOTE — ED Triage Notes (Signed)
Pt to ED via POV from work for syncopal episode. Pt states that she was at work and was standing and she felt her knees go out. Pt states that they checked her blood pressure at work and it was 90/70. Pt states that she is having some shortness of breath. Pt is currently in NAD.  ?
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
## 2021-10-16 NOTE — ED Provider Notes (Signed)
? ?[facility] ?Provider Note ? ? ? Event Date/Time  ? First MD Initiated Contact with Patient 10/16/21 1327   ?  (approximate) ? ? ?History  ? ?Loss of Consciousness ? ? ?HPI ? ?Bridget Zimmerman is a 41 y.o. female with no significant past medical history who comes to the ED due to syncope.  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  She was told that her initial blood pressure at that time was 90/70.  She denies any preceding symptoms such as chest pain shortness of breath abdominal pain back pain headache vision changes or focal paresthesia or weakness.  No recent trauma, no significant trauma from falling.  Denies any current symptoms. ? ?She does note that over the last 2 days she has had loss of taste, body aches, fatigue, generalized headache.  The symptoms have been gradual onset, waxing and waning without aggravating or alleviating factors.  No known sick contacts. ?  ? ? ?Physical Exam  ? ?Triage Vital Signs: ?ED Triage Vitals  ?Enc Vitals Group  ?   BP 10/16/21 1129 129/83  ?   Pulse Rate 10/16/21 1129 97  ?   Resp 10/16/21 1129 16  ?   Temp 10/16/21 1129 99.2 ?F (37.3 ?C)  ?   Temp Source 10/16/21 1129 Oral  ?   SpO2 10/16/21 1129 98 %  ?   Weight 10/16/21 1130 193 lb (87.5 kg)  ?   Height 10/16/21 1130 5\' 3"  (1.6 m)  ?   Head Circumference --   ?   Peak Flow --   ?   Pain Score 10/16/21 1130 10  ?   Pain Loc --   ?   Pain Edu? --   ?   Excl. in [place]? --   ? ? ?Most recent vital signs: ?Vitals:  ? 10/16/21 1129 10/16/21 1341  ?BP: 129/83 120/80  ?Pulse: 97 90  ?Resp: 16 16  ?Temp: 99.2 ?F (37.3 ?C)   ?SpO2: 98% 98%  ? ? ? ?General: Awake, no distress.  ?CV:  Good peripheral perfusion.  Regular rate and rhythm ?Resp:  Normal effort.  Clear to auscultation bilaterally ?Abd:  No distention.  Soft nontender ?Other:  No lower extremity edema.  Moist oral mucosa ? ? ?ED Results / Procedures / Treatments   ? ?Labs ?(all labs ordered are listed, but only abnormal results are displayed) ?Labs Reviewed  ?CBC WITH DIFFERENTIAL/PLATELET - Abnormal; Notable for the following components:  ?    Result Value  ? Neutro Abs 8.0 (*)   ? Lymphs Abs 0.6 (*)   ? All other components within normal limits  ?RESP PANEL BY RT-PCR (FLU A&B, COVID) [id number]  ?BASIC METABOLIC PANEL  ?CBG MONITORING, ED  ?POC URINE PREG, ED  ? ? ? ?EKG ? ?Interpreted by me ?Normal sinus rhythm rate of 97.  Normal axis and intervals.  Normal QRS ST segments and T waves.  No ischemic changes. ? ? ?RADIOLOGY ?CT head viewed and interpreted by me, negative for mass or intracranial hemorrhage.  Radiology report reviewed. ? ? ? ?PROCEDURES: ? ?Critical Care performed: No ? ?Procedures ? ? ?MEDICATIONS ORDERED IN ED: ?Medications  ?ondansetron (ZOFRAN-ODT) disintegrating tablet 8 mg (8 mg Oral Given 10/16/21 1351)  ?naproxen (NAPROSYN) tablet 500 mg (500 mg Oral Given 10/16/21 1351)  ? ? ? ?IMPRESSION / MDM / ASSESSMENT AND PLAN / ED  COURSE  ?I reviewed the triage vital signs and the nursing notes. ?             ?               ? ?Differential diagnosis includes, but is not limited to, dehydration, vagal episode, heat syncope, anemia, electrolyte abnormality, intracranial hemorrhage, viral illness ? ?Patient presents with syncope without significant prodromal symptoms.  She is relatively asymptomatic except for some mild constitutional symptoms that have been going on for the last 2 days.  Vital signs are normal.  Lab panel is all unremarkable.  CT head is negative. ? ?Does not require admission.  She is nontoxic and suitable for outpatient follow-up.  Doubt stroke, ICH, ACS, PE, dissection, AAA.  No evidence of infection or sepsis. ?  ? ? ?FINAL CLINICAL IMPRESSION(S) / ED DIAGNOSES  ? ?Final diagnoses:  ?Syncope, unspecified syncope type  ? ? ? ?Rx / DC Orders  ? ?ED Discharge Orders   ? ?      Ordered  ?  ondansetron (ZOFRAN-ODT) 4 MG disintegrating tablet  Every  8 hours PRN       ? 10/16/21 1338  ? ?  ?  ? ?  ? ? ? ?Note:  This document was prepared using Dragon voice recognition software and may include unintentional dictation errors. ?  ?Carrie Mew, MD ?10/16/21 1409 ? ?

## 2022-11-05 ENCOUNTER — Ambulatory Visit: Payer: Self-pay

## 2022-11-05 NOTE — Telephone Encounter (Signed)
  Chief Complaint: Vaginal pain when sitting Symptoms: Above Frequency: 1 year Pertinent Negatives: Patient denies  Disposition: [] ED /[x] Urgent Care (no appt availability in office) / [] Appointment(In office/virtual)/ []  [facility]/ [] Home Care/ [] Refused Recommended Disposition /[] [place] [facility]/ []  Follow-up with PCP Additional Notes: Pt  and grandmother called regarding vaginal pain. Pt states that pain has been present for 1 year, but has recently gotten much worse. Pt will go to UC for care.    Reason for Disposition  [1] SEVERE pain AND [2] not improved 2 hours after pain medicine  Answer Assessment - Initial Assessment Questions 1. SYMPTOM: "What's the main symptom you're concerned about?" (e.g., pain, itching, dryness)     Pain 2. LOCATION: "Where is the  pain located?" (e.g., inside/outside, left/right)     Vagina 3. ONSET: "When did the  pain  start?"     1 year 4. PAIN: "Is there any pain?" If Yes, ask: "How bad is it?" (Scale: 1-10; mild, moderate, severe)   -  MILD (1-3): Doesn't interfere with normal activities.    -  MODERATE (4-7): Interferes with normal activities (e.g., work or school) or awakens from sleep.     -  SEVERE (8-10): Excruciating pain, unable to do any normal activities.     10/10 - constant when she sits down 5. ITCHING: "Is there any itching?" If Yes, ask: "How bad is it?" (Scale: 1-10; mild, moderate, severe)     itching 6. CAUSE: "What do you think is causing the discharge?" "Have you had the same problem before? What happened then?"     no 7. OTHER SYMPTOMS: "Do you have any other symptoms?" (e.g., fever, itching, vaginal bleeding, pain with urination, injury to genital area, vaginal foreign body)     no 8. PREGNANCY: "Is there any chance you are pregnant?" "When was your last menstrual period?"     no  Protocols used: Vaginal Symptoms-A-AH

## 2024-01-30 DIAGNOSIS — M25522 Pain in left elbow: Secondary | ICD-10-CM | POA: Insufficient documentation

## 2024-02-11 DIAGNOSIS — G5622 Lesion of ulnar nerve, left upper limb: Secondary | ICD-10-CM | POA: Insufficient documentation

## 2024-06-09 ENCOUNTER — Encounter: Payer: Self-pay | Admitting: Family Medicine

## 2024-06-09 ENCOUNTER — Ambulatory Visit: Admitting: Family Medicine

## 2024-06-09 ENCOUNTER — Telehealth: Payer: Self-pay | Admitting: Family Medicine

## 2024-06-09 VITALS — BP 131/84 | HR 94 | Resp 16 | Ht 63.0 in | Wt 220.0 lb

## 2024-06-09 DIAGNOSIS — R5383 Other fatigue: Secondary | ICD-10-CM

## 2024-06-09 DIAGNOSIS — F32A Depression, unspecified: Secondary | ICD-10-CM

## 2024-06-09 DIAGNOSIS — J4520 Mild intermittent asthma, uncomplicated: Secondary | ICD-10-CM | POA: Diagnosis not present

## 2024-06-09 DIAGNOSIS — G894 Chronic pain syndrome: Secondary | ICD-10-CM

## 2024-06-09 DIAGNOSIS — F419 Anxiety disorder, unspecified: Secondary | ICD-10-CM

## 2024-06-09 DIAGNOSIS — Z0000 Encounter for general adult medical examination without abnormal findings: Secondary | ICD-10-CM

## 2024-06-09 DIAGNOSIS — Z7689 Persons encountering health services in other specified circumstances: Secondary | ICD-10-CM | POA: Diagnosis not present

## 2024-06-09 MED ORDER — ALBUTEROL SULFATE HFA 108 (90 BASE) MCG/ACT IN AERS: 2.0000 | INHALATION_SPRAY | RESPIRATORY_TRACT | 2 refills | Status: DC | PRN

## 2024-06-09 NOTE — Patient Instructions (Signed)
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
## 2024-06-09 NOTE — Telephone Encounter (Signed)
 Copied from CRM [id number]. Topic: Clinical - Medical Advice >> Jun 09, 2024 12:03 PM Winona R wrote: Pt would like to know if her labs she had done today would test for all cancers >> Jun 09, 2024 12:06 PM Winona R wrote: Pt  states it is ok to leave a voicemail if she does not answer

## 2024-06-10 ENCOUNTER — Other Ambulatory Visit: Payer: Self-pay | Admitting: Family Medicine

## 2024-06-10 DIAGNOSIS — J4520 Mild intermittent asthma, uncomplicated: Secondary | ICD-10-CM

## 2024-06-10 LAB — VITAMIN B12: Vitamin B-12: 597 pg/mL (ref 232–1245)

## 2024-06-10 LAB — COMPREHENSIVE METABOLIC PANEL WITH GFR
ALT: 30 IU/L (ref 0–32)
AST: 23 IU/L (ref 0–40)
Albumin: 4.7 g/dL (ref 3.9–4.9)
Alkaline Phosphatase: 77 IU/L (ref 41–116)
BUN/Creatinine Ratio: 11 (ref 9–23)
BUN: 8 mg/dL (ref 6–24)
Bilirubin Total: 0.5 mg/dL (ref 0.0–1.2)
CO2: 21 mmol/L (ref 20–29)
Calcium: 9.6 mg/dL (ref 8.7–10.2)
Chloride: 101 mmol/L (ref 96–106)
Creatinine, Ser: 0.7 mg/dL (ref 0.57–1.00)
Globulin, Total: 2.8 g/dL (ref 1.5–4.5)
Glucose: 89 mg/dL (ref 70–99)
Potassium: 4.4 mmol/L (ref 3.5–5.2)
Sodium: 140 mmol/L (ref 134–144)
Total Protein: 7.5 g/dL (ref 6.0–8.5)
eGFR: 110 mL/min/1.73

## 2024-06-10 LAB — LIPID PANEL
Chol/HDL Ratio: 4.4 ratio (ref 0.0–4.4)
Cholesterol, Total: 193 mg/dL (ref 100–199)
HDL: 44 mg/dL
LDL Chol Calc (NIH): 119 mg/dL — ABNORMAL HIGH (ref 0–99)
Triglycerides: 170 mg/dL — ABNORMAL HIGH (ref 0–149)
VLDL Cholesterol Cal: 30 mg/dL (ref 5–40)

## 2024-06-10 LAB — CBC WITH DIFFERENTIAL/PLATELET
Basophils Absolute: 0.1 x10E3/uL (ref 0.0–0.2)
Basos: 1 %
EOS (ABSOLUTE): 0.2 x10E3/uL (ref 0.0–0.4)
Eos: 3 %
Hematocrit: 40.3 % (ref 34.0–46.6)
Hemoglobin: 13 g/dL (ref 11.1–15.9)
Immature Grans (Abs): 0 x10E3/uL (ref 0.0–0.1)
Immature Granulocytes: 0 %
Lymphocytes Absolute: 2.3 x10E3/uL (ref 0.7–3.1)
Lymphs: 30 %
MCH: 30.6 pg (ref 26.6–33.0)
MCHC: 32.3 g/dL (ref 31.5–35.7)
MCV: 95 fL (ref 79–97)
Monocytes Absolute: 0.5 x10E3/uL (ref 0.1–0.9)
Monocytes: 7 %
Neutrophils Absolute: 4.6 x10E3/uL (ref 1.4–7.0)
Neutrophils: 59 %
Platelets: 293 x10E3/uL (ref 150–450)
RBC: 4.25 x10E6/uL (ref 3.77–5.28)
RDW: 12.2 % (ref 11.7–15.4)
WBC: 7.8 x10E3/uL (ref 3.4–10.8)

## 2024-06-10 LAB — VITAMIN D 25 HYDROXY (VIT D DEFICIENCY, FRACTURES): Vit D, 25-Hydroxy: 15 ng/mL — ABNORMAL LOW (ref 30.0–100.0)

## 2024-06-10 LAB — HEMOGLOBIN A1C
Est. average glucose Bld gHb Est-mCnc: 111 mg/dL
Hgb A1c MFr Bld: 5.5 % (ref 4.8–5.6)

## 2024-06-10 LAB — TSH RFX ON ABNORMAL TO FREE T4: TSH: 2.4 u[IU]/mL (ref 0.450–4.500)

## 2024-06-10 NOTE — Telephone Encounter (Unsigned)
 Copied from CRM [id number]. Topic: Clinical - Lab/Test Results >> Jun 10, 2024  9:24 AM Mia F wrote: Reason for CRM: Pt is calling to get the results from her recent labs

## 2024-06-11 ENCOUNTER — Ambulatory Visit: Payer: Self-pay | Admitting: Family Medicine

## 2024-06-11 ENCOUNTER — Other Ambulatory Visit: Payer: Self-pay | Admitting: Family Medicine

## 2024-06-11 DIAGNOSIS — E559 Vitamin D deficiency, unspecified: Secondary | ICD-10-CM

## 2024-06-11 MED ORDER — VITAMIN D (ERGOCALCIFEROL) 1.25 MG (50000 UNIT) PO CAPS: 50000.0000 [IU] | ORAL_CAPSULE | ORAL | 0 refills | Status: AC
